# Patient Record
Sex: Male | Born: 1975
Health system: Southern US, Community
[De-identification: ages and names within clinical notes are randomized; demographics above are authoritative.]

## PROBLEM LIST (undated history)

## (undated) DIAGNOSIS — R5382 Chronic fatigue, unspecified: Secondary | ICD-10-CM

## (undated) DIAGNOSIS — E039 Hypothyroidism, unspecified: Secondary | ICD-10-CM

## (undated) DIAGNOSIS — J449 Chronic obstructive pulmonary disease, unspecified: Secondary | ICD-10-CM

## (undated) DIAGNOSIS — W3400XA Accidental discharge from unspecified firearms or gun, initial encounter: Secondary | ICD-10-CM

## (undated) DIAGNOSIS — G4700 Insomnia, unspecified: Secondary | ICD-10-CM

## (undated) DIAGNOSIS — I471 Supraventricular tachycardia, unspecified: Secondary | ICD-10-CM

## (undated) DIAGNOSIS — F419 Anxiety disorder, unspecified: Secondary | ICD-10-CM

## (undated) DIAGNOSIS — R413 Other amnesia: Secondary | ICD-10-CM

## (undated) DIAGNOSIS — M1990 Unspecified osteoarthritis, unspecified site: Secondary | ICD-10-CM

## (undated) DIAGNOSIS — J42 Unspecified chronic bronchitis: Secondary | ICD-10-CM

## (undated) DIAGNOSIS — J45909 Unspecified asthma, uncomplicated: Secondary | ICD-10-CM

## (undated) DIAGNOSIS — G43909 Migraine, unspecified, not intractable, without status migrainosus: Secondary | ICD-10-CM

## (undated) DIAGNOSIS — F909 Attention-deficit hyperactivity disorder, unspecified type: Secondary | ICD-10-CM

## (undated) DIAGNOSIS — R7303 Prediabetes: Secondary | ICD-10-CM

## (undated) DIAGNOSIS — G9050 Complex regional pain syndrome I, unspecified: Secondary | ICD-10-CM

## (undated) DIAGNOSIS — F32A Depression, unspecified: Secondary | ICD-10-CM

## (undated) DIAGNOSIS — D72829 Elevated white blood cell count, unspecified: Secondary | ICD-10-CM

## (undated) DIAGNOSIS — I10 Essential (primary) hypertension: Secondary | ICD-10-CM

## (undated) DIAGNOSIS — E2740 Unspecified adrenocortical insufficiency: Secondary | ICD-10-CM

## (undated) DIAGNOSIS — K219 Gastro-esophageal reflux disease without esophagitis: Secondary | ICD-10-CM

## (undated) HISTORY — DX: Insomnia, unspecified: G47.00

## (undated) HISTORY — DX: Other amnesia: R41.3

## (undated) HISTORY — DX: Anxiety disorder, unspecified: F41.9

## (undated) HISTORY — DX: Supraventricular tachycardia, unspecified: I47.10

## (undated) HISTORY — DX: Unspecified adrenocortical insufficiency: E27.40

## (undated) HISTORY — DX: Unspecified chronic bronchitis: J42

## (undated) HISTORY — DX: Complex regional pain syndrome I, unspecified: G90.50

## (undated) HISTORY — DX: Attention-deficit hyperactivity disorder, unspecified type: F90.9

## (undated) HISTORY — DX: Prediabetes: R73.03

## (undated) HISTORY — DX: Hypothyroidism, unspecified: E03.9

## (undated) HISTORY — DX: Unspecified osteoarthritis, unspecified site: M19.90

## (undated) HISTORY — DX: Chronic obstructive pulmonary disease, unspecified: J44.9

## (undated) HISTORY — DX: Unspecified asthma, uncomplicated: J45.909

## (undated) HISTORY — DX: Chronic fatigue, unspecified: R53.82

## (undated) HISTORY — DX: Accidental discharge from unspecified firearms or gun, initial encounter: W34.00XA

## (undated) HISTORY — DX: Essential (primary) hypertension: I10

## (undated) HISTORY — DX: Gastro-esophageal reflux disease without esophagitis: K21.9

## (undated) HISTORY — DX: Elevated white blood cell count, unspecified: D72.829

## (undated) HISTORY — DX: Depression, unspecified: F32.A

## (undated) HISTORY — DX: Migraine, unspecified, not intractable, without status migrainosus: G43.909

---
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
## 2012-07-31 ENCOUNTER — Other Ambulatory Visit: Payer: Self-pay | Admitting: Occupational Medicine

## 2012-07-31 ENCOUNTER — Ambulatory Visit
Admission: RE | Admit: 2012-07-31 | Discharge: 2012-07-31 | Disposition: A | Discharge status: Home / Self Care | Payer: Worker's Compensation | Source: Ambulatory Visit | Attending: Occupational Medicine | Admitting: Occupational Medicine

## 2012-07-31 DIAGNOSIS — R52 Pain, unspecified: Secondary | ICD-10-CM

## 2015-06-22 ENCOUNTER — Other Ambulatory Visit: Payer: Self-pay | Admitting: Nurse Practitioner

## 2015-06-22 ENCOUNTER — Ambulatory Visit
Admission: RE | Admit: 2015-06-22 | Discharge: 2015-06-22 | Disposition: A | Discharge status: Home / Self Care | Payer: Worker's Compensation | Source: Ambulatory Visit | Attending: Nurse Practitioner | Admitting: Nurse Practitioner

## 2015-06-22 DIAGNOSIS — T1490XA Injury, unspecified, initial encounter: Secondary | ICD-10-CM

## 2015-06-22 DIAGNOSIS — R52 Pain, unspecified: Secondary | ICD-10-CM

## 2016-07-15 DIAGNOSIS — I10 Essential (primary) hypertension: Secondary | ICD-10-CM | POA: Diagnosis not present

## 2016-07-15 DIAGNOSIS — K219 Gastro-esophageal reflux disease without esophagitis: Secondary | ICD-10-CM | POA: Diagnosis not present

## 2016-07-15 DIAGNOSIS — E034 Atrophy of thyroid (acquired): Secondary | ICD-10-CM | POA: Diagnosis not present

## 2016-12-01 DIAGNOSIS — G43009 Migraine without aura, not intractable, without status migrainosus: Secondary | ICD-10-CM | POA: Diagnosis not present

## 2016-12-23 DIAGNOSIS — E034 Atrophy of thyroid (acquired): Secondary | ICD-10-CM | POA: Diagnosis not present

## 2016-12-23 DIAGNOSIS — I10 Essential (primary) hypertension: Secondary | ICD-10-CM | POA: Diagnosis not present

## 2016-12-30 DIAGNOSIS — L821 Other seborrheic keratosis: Secondary | ICD-10-CM | POA: Diagnosis not present

## 2016-12-30 DIAGNOSIS — D1801 Hemangioma of skin and subcutaneous tissue: Secondary | ICD-10-CM | POA: Diagnosis not present

## 2017-04-27 DIAGNOSIS — I10 Essential (primary) hypertension: Secondary | ICD-10-CM | POA: Diagnosis not present

## 2017-04-27 DIAGNOSIS — R5383 Other fatigue: Secondary | ICD-10-CM | POA: Diagnosis not present

## 2017-04-27 DIAGNOSIS — K219 Gastro-esophageal reflux disease without esophagitis: Secondary | ICD-10-CM | POA: Diagnosis not present

## 2017-04-27 DIAGNOSIS — E038 Other specified hypothyroidism: Secondary | ICD-10-CM | POA: Diagnosis not present

## 2017-04-27 DIAGNOSIS — Z23 Encounter for immunization: Secondary | ICD-10-CM | POA: Diagnosis not present

## 2017-08-17 DIAGNOSIS — L03211 Cellulitis of face: Secondary | ICD-10-CM | POA: Diagnosis not present

## 2017-08-21 DIAGNOSIS — J060 Acute laryngopharyngitis: Secondary | ICD-10-CM | POA: Diagnosis not present

## 2017-08-26 DIAGNOSIS — L309 Dermatitis, unspecified: Secondary | ICD-10-CM | POA: Diagnosis not present

## 2017-08-27 DIAGNOSIS — B379 Candidiasis, unspecified: Secondary | ICD-10-CM | POA: Diagnosis not present

## 2017-10-24 DIAGNOSIS — J9801 Acute bronchospasm: Secondary | ICD-10-CM | POA: Diagnosis not present

## 2017-10-24 DIAGNOSIS — R079 Chest pain, unspecified: Secondary | ICD-10-CM | POA: Diagnosis not present

## 2017-10-24 DIAGNOSIS — R918 Other nonspecific abnormal finding of lung field: Secondary | ICD-10-CM | POA: Diagnosis not present

## 2017-10-24 DIAGNOSIS — R05 Cough: Secondary | ICD-10-CM | POA: Diagnosis not present

## 2017-10-24 DIAGNOSIS — R062 Wheezing: Secondary | ICD-10-CM | POA: Diagnosis not present

## 2017-10-24 DIAGNOSIS — J208 Acute bronchitis due to other specified organisms: Secondary | ICD-10-CM | POA: Diagnosis not present

## 2017-11-16 DIAGNOSIS — Z719 Counseling, unspecified: Secondary | ICD-10-CM | POA: Diagnosis not present

## 2017-11-23 DIAGNOSIS — K219 Gastro-esophageal reflux disease without esophagitis: Secondary | ICD-10-CM | POA: Diagnosis not present

## 2017-11-23 DIAGNOSIS — R5383 Other fatigue: Secondary | ICD-10-CM | POA: Diagnosis not present

## 2017-11-23 DIAGNOSIS — I10 Essential (primary) hypertension: Secondary | ICD-10-CM | POA: Diagnosis not present

## 2017-11-23 DIAGNOSIS — E038 Other specified hypothyroidism: Secondary | ICD-10-CM | POA: Diagnosis not present

## 2018-01-17 DIAGNOSIS — R799 Abnormal finding of blood chemistry, unspecified: Secondary | ICD-10-CM | POA: Diagnosis not present

## 2018-01-17 DIAGNOSIS — E034 Atrophy of thyroid (acquired): Secondary | ICD-10-CM | POA: Diagnosis not present

## 2018-02-27 DIAGNOSIS — I10 Essential (primary) hypertension: Secondary | ICD-10-CM | POA: Diagnosis not present

## 2018-02-27 DIAGNOSIS — K219 Gastro-esophageal reflux disease without esophagitis: Secondary | ICD-10-CM | POA: Diagnosis not present

## 2018-05-07 DIAGNOSIS — J060 Acute laryngopharyngitis: Secondary | ICD-10-CM | POA: Diagnosis not present

## 2018-06-14 DIAGNOSIS — Z6835 Body mass index (BMI) 35.0-35.9, adult: Secondary | ICD-10-CM | POA: Diagnosis not present

## 2018-06-14 DIAGNOSIS — Z0000 Encounter for general adult medical examination without abnormal findings: Secondary | ICD-10-CM | POA: Diagnosis not present

## 2018-09-20 DIAGNOSIS — K219 Gastro-esophageal reflux disease without esophagitis: Secondary | ICD-10-CM | POA: Diagnosis not present

## 2018-09-20 DIAGNOSIS — E782 Mixed hyperlipidemia: Secondary | ICD-10-CM | POA: Diagnosis not present

## 2018-09-20 DIAGNOSIS — E038 Other specified hypothyroidism: Secondary | ICD-10-CM | POA: Diagnosis not present

## 2018-09-20 DIAGNOSIS — I10 Essential (primary) hypertension: Secondary | ICD-10-CM | POA: Diagnosis not present

## 2018-10-19 DIAGNOSIS — I10 Essential (primary) hypertension: Secondary | ICD-10-CM | POA: Diagnosis not present

## 2018-10-19 DIAGNOSIS — E039 Hypothyroidism, unspecified: Secondary | ICD-10-CM | POA: Diagnosis not present

## 2018-10-19 DIAGNOSIS — K219 Gastro-esophageal reflux disease without esophagitis: Secondary | ICD-10-CM | POA: Diagnosis not present

## 2018-10-19 DIAGNOSIS — N529 Male erectile dysfunction, unspecified: Secondary | ICD-10-CM | POA: Diagnosis not present

## 2018-10-19 DIAGNOSIS — R5382 Chronic fatigue, unspecified: Secondary | ICD-10-CM | POA: Diagnosis not present

## 2018-11-02 DIAGNOSIS — K219 Gastro-esophageal reflux disease without esophagitis: Secondary | ICD-10-CM | POA: Diagnosis not present

## 2018-11-02 DIAGNOSIS — I10 Essential (primary) hypertension: Secondary | ICD-10-CM | POA: Diagnosis not present

## 2018-11-29 DIAGNOSIS — J309 Allergic rhinitis, unspecified: Secondary | ICD-10-CM | POA: Diagnosis not present

## 2018-11-29 DIAGNOSIS — K219 Gastro-esophageal reflux disease without esophagitis: Secondary | ICD-10-CM | POA: Diagnosis not present

## 2020-02-28 ENCOUNTER — Encounter: Payer: Self-pay | Admitting: Emergency Medicine

## 2020-02-28 ENCOUNTER — Ambulatory Visit: Payer: 59 | Admitting: Emergency Medicine

## 2020-02-28 ENCOUNTER — Ambulatory Visit (INDEPENDENT_AMBULATORY_CARE_PROVIDER_SITE_OTHER): Payer: 59

## 2020-02-28 ENCOUNTER — Other Ambulatory Visit: Payer: Self-pay

## 2020-02-28 DIAGNOSIS — R0600 Dyspnea, unspecified: Secondary | ICD-10-CM | POA: Insufficient documentation

## 2020-02-28 DIAGNOSIS — R0602 Shortness of breath: Secondary | ICD-10-CM | POA: Diagnosis not present

## 2020-02-28 NOTE — Assessment & Plan Note (Signed)
Persistent symptoms, frequent prednisone/steroid use. Question multifactorial.  Some of his symptoms sound consistent with obstructive lung disease, some upper airway lability.  Consider also impact of GERD, rhinitis, even anxiety or panic.  I think he needs pulmonary function testing to assess his degree of obstruction.  Given his age we will check an alpha-1 antitrypsin genotype and phenotype. CXR today.  Continue Symbicort 2 puffs twice a day for now.  We may decide to change this going forward Keep albuterol available use 2 puffs when you need if shortness of breath, chest tightness, wheezing. We will perform full pulmonary function testing in next office visit Blood work today (alpha-1 antitrypsin testing) Follow with Dr. Lamonte Sakai next available with full pulmonary function testing on the same day.

## 2020-02-28 NOTE — Progress Notes (Signed)
Subjective:    Patient ID: Gary Holland, male    DOB: [birth year], 44 y.o.   MRN: [id number]   HPI 44 year old man with a history of heavy tobacco use (150 pack years, quit 3 yrs ago), history of ADD, GERD, hypertension, COPD/asthma that was diagnosed in his 72's. Has been on Symbicort - wasn't sure it did very much.  He and his wife both had Covid in fall 2020.  He improved, only residual symptoms were loss of taste and smell.  Has been experiencing dyspnea since about 2 months ago.  Present at rest or w exertion, often associated with episodes of tachycardia. No documented A fib. Has chest pressure and heaviness. He hears some wheeze at night, when he is supine. He has cough, often induced by heat, cold, exercise. Usually non-productive. He has been on prednisone 5 times this year. Last kenalog was last week.   He was evaluated by cardiology 01/23/2020.  Stress echocardiogram done showed no ECG changes or echocardiographic changes consistent with ischemia. His Adderall was held while the cards eval is underway.    Review of Systems As per HPI  Past Medical History:  Diagnosis Date  . Asthma   . Bronchitis, chronic ([place])   . COPD (chronic obstructive pulmonary disease) ([facility])   ADD, GERD, hypertension, anxiety  Family history: Hypertension, thyroid cancer, diabetic kidney disease, CAD  Social History   Socioeconomic History  . Marital status: Single    Spouse name: Not on file  . Number of children: Not on file  . Years of education: Not on file  . Highest education level: Not on file  Occupational History  . Not on file  Tobacco Use  . Smoking status: Former Smoker    Packs/day: 5.00    Years: 30.00    Pack years: 150.00    Types: Cigarettes    Quit date: 2018    Years since quitting: 3.6  . Smokeless tobacco: Former Network engineer and Sexual Activity  . Alcohol use: Not on file  . Drug use: Not on file  . Sexual activity: Not on file  Other Topics Concern  . Not  on file  Social History Narrative  . Not on file   Social Determinants of Health   Financial Resource Strain:   . Difficulty of Paying Living Expenses: Not on file  Food Insecurity:   . Worried About Charity fundraiser in the Last Year: Not on file  . Ran Out of Food in the Last Year: Not on file  Transportation Needs:   . Lack of Transportation (Medical): Not on file  . Lack of Transportation (Non-Medical): Not on file  Physical Activity:   . Days of Exercise per Week: Not on file  . Minutes of Exercise per Session: Not on file  Stress:   . Feeling of Stress : Not on file  Social Connections:   . Frequency of Communication with Friends and Family: Not on file  . Frequency of Social Gatherings with Friends and Family: Not on file  . Attends Religious Services: Not on file  . Active Member of Clubs or Organizations: Not on file  . Attends Archivist Meetings: Not on file  . Marital Status: Not on file  Intimate Partner Violence:   . Fear of Current or Ex-Partner: Not on file  . Emotionally Abused: Not on file  . Physically Abused: Not on file  . Sexually Abused: Not on file   He has worked  as a heavy Company secretary doing plating for [employer]  Not on File   Outpatient Medications Prior to Visit  Medication Sig Dispense Refill  . albuterol (VENTOLIN HFA) 108 (90 Base) MCG/ACT inhaler SMARTSIG:2 Puff(s) By Mouth Every 4 Hours PRN    . ALPRAZolam (XANAX) 0.25 MG tablet Take 0.25 mg by mouth every 6 (six) hours as needed.    Marland Kitchen amphetamine-dextroamphetamine (ADDERALL XR) 25 MG 24 hr capsule Take 1 capsule by mouth 2 (two) times daily.    . cetirizine (ZYRTEC) 10 MG tablet cetirizine 10 mg tablet  TK 1 T PO QD    . escitalopram (LEXAPRO) 10 MG tablet Take 10 mg by mouth daily.    . hydrochlorothiazide (HYDRODIURIL) 25 MG tablet Take 25 mg by mouth daily.    Marland Kitchen levothyroxine (SYNTHROID) 200 MCG tablet Take 200 mcg by mouth daily.    . meloxicam (MOBIC) 15 MG  tablet Take 15 mg by mouth 3 (three) times daily as needed.    . metoprolol succinate (TOPROL-XL) 100 MG 24 hr tablet Take 100 mg by mouth daily.    . nitroGLYCERIN (NITROSTAT) 0.4 MG SL tablet Place under the tongue.    . sildenafil (VIAGRA) 100 MG tablet Take 1 tablet by mouth as needed.    . SYMBICORT 160-4.5 MCG/ACT inhaler SMARTSIG:2 Puff(s) By Mouth Twice Daily    . traMADol (ULTRAM) 50 MG tablet Take 50 mg by mouth 3 (three) times daily as needed.    . zolpidem (AMBIEN) 10 MG tablet Take 10 mg by mouth at bedtime as needed.     No facility-administered medications prior to visit.         Objective:   Physical Exam Vitals:   02/28/20 1445  BP: 124/72  Pulse: (!) 104  Temp: 97.8 F (36.6 C)  TempSrc: Temporal  SpO2: 98%  Weight: 240 lb 12.8 oz (109.2 kg)  Height: 5\' 8"  (1.727 m)   Gen: Pleasant, overwt man, in no distress,  normal affect  ENT: No lesions,  mouth clear,  oropharynx clear, no postnasal drip  Neck: No JVD, stridor present on a forced exp associated w cough  Lungs: No use of accessory muscles, distant, no crackles or wheezing on normal respiration, no wheeze on forced expiration  Cardiovascular: RRR, heart sounds normal, no murmur or gallops, no peripheral edema  Musculoskeletal: No deformities, no cyanosis or clubbing  Neuro: alert, awake, non focal  Skin: Warm, no lesions or rash      Assessment & Plan:  Dyspnea Persistent symptoms, frequent prednisone/steroid use. Question multifactorial.  Some of his symptoms sound consistent with obstructive lung disease, some upper airway lability.  Consider also impact of GERD, rhinitis, even anxiety or panic.  I think he needs pulmonary function testing to assess his degree of obstruction.  Given his age we will check an alpha-1 antitrypsin genotype and phenotype. CXR today.  Continue Symbicort 2 puffs twice a day for now.  We may decide to change this going forward Keep albuterol available use 2 puffs when  you need if shortness of breath, chest tightness, wheezing. We will perform full pulmonary function testing in next office visit Blood work today (alpha-1 antitrypsin testing) Follow with Dr. Lamonte Sakai next available with full pulmonary function testing on the same day.   Baltazar Apo, MD, PhD 02/28/2020, 3:19 PM [facility] [phone] or if no answer [phone]

## 2020-02-28 NOTE — Patient Instructions (Signed)
Continue Symbicort 2 puffs twice a day for now.  We may decide to change this going forward Keep albuterol available use 2 puffs when you need if shortness of breath, chest tightness, wheezing. We will perform full pulmonary function testing in next office visit Blood work today (alpha-1 antitrypsin testing) Follow with Dr. Lamonte Sakai next available with full pulmonary function testing on the same day.

## 2020-03-11 LAB — ALPHA-1 ANTITRYPSIN PHENOTYPE: A-1 Antitrypsin, Ser: 154 mg/dL (ref 83–199)

## 2020-04-13 ENCOUNTER — Encounter: Payer: Self-pay | Admitting: Emergency Medicine

## 2020-04-13 ENCOUNTER — Other Ambulatory Visit: Payer: Self-pay

## 2020-04-13 ENCOUNTER — Ambulatory Visit (INDEPENDENT_AMBULATORY_CARE_PROVIDER_SITE_OTHER): Payer: 59 | Admitting: Emergency Medicine

## 2020-04-13 ENCOUNTER — Ambulatory Visit: Payer: 59 | Admitting: Emergency Medicine

## 2020-04-13 DIAGNOSIS — R0602 Shortness of breath: Secondary | ICD-10-CM

## 2020-04-13 LAB — PULMONARY FUNCTION TEST
DL/VA % pred: 82 %
DL/VA: 3.77 ml/min/mmHg/L
DLCO cor % pred: 81 %
DLCO cor: 23.03 ml/min/mmHg
DLCO unc % pred: 81 %
DLCO unc: 23.03 ml/min/mmHg
FEF 25-75 Post: 1.84 L/sec
FEF 25-75 Pre: 3.19 L/sec
FEF2575-%Change-Post: -42 %
FEF2575-%Pred-Post: 51 %
FEF2575-%Pred-Pre: 90 %
FEV1-%Change-Post: -9 %
FEV1-%Pred-Post: 82 %
FEV1-%Pred-Pre: 91 %
FEV1-Post: 3.17 L
FEV1-Pre: 3.51 L
FEV1FVC-%Change-Post: 9 %
FEV1FVC-%Pred-Pre: 100 %
FEV6-%Change-Post: -17 %
FEV6-%Pred-Post: 77 %
FEV6-%Pred-Pre: 94 %
FEV6-Post: 3.64 L
FEV6-Pre: 4.43 L
FEV6FVC-%Pred-Post: 103 %
FEV6FVC-%Pred-Pre: 103 %
FVC-%Change-Post: -17 %
FVC-%Pred-Post: 75 %
FVC-%Pred-Pre: 91 %
FVC-Post: 3.64 L
FVC-Pre: 4.43 L
Post FEV1/FVC ratio: 87 %
Post FEV6/FVC ratio: 100 %
Pre FEV1/FVC ratio: 79 %
Pre FEV6/FVC Ratio: 100 %
RV % pred: 77 %
RV: 1.41 L
TLC % pred: 91 %
TLC: 6.01 L

## 2020-04-13 MED ORDER — DEXILANT 60 MG PO CPDR: DELAYED_RELEASE_CAPSULE | ORAL | 0 refills | Status: AC

## 2020-04-13 NOTE — Patient Instructions (Signed)
Congratulations on decreasing your e-cigarettes.  Keep up the good work, try to decrease further if you can.  Our ultimate goal will be to stop completely. Stop Symbicort. Keep your albuterol available use 2 puffs if needed for shortness of breath, chest tightness, wheezing. Temporarily increase your Dexilant to twice a day for the next 3 to 4 weeks.  Then go back to once a day Agree with raising the head of your bed Do your best to adhere to a GERD diet.  Do not eat after 8:00 PM. Continue Zyrtec once daily.  You might want to consider rotating this every few months with Claritin, Allegra Start taking your fluticasone (Flonase) nasal spray 2 sprays each nostril once daily every day Do you very best to avoid throat clearing.  You might get some benefit from using sugar-free candy or not mentholated cough drop.  When you have the desire to clear your throat, just swallow Please follow-up with Dr. Truman Hayward regarding your blood pressure medication and the side effects that you are experiencing. Follow Dr. Lamonte Sakai in 3 months.  Call sooner if you are having any problems.

## 2020-04-13 NOTE — Addendum Note (Signed)
Addended by: Gavin Potters R on: 04/13/2020 12:17 PM   Modules accepted: Orders

## 2020-04-13 NOTE — Progress Notes (Signed)
Subjective:    Patient ID: Gary Holland, male    DOB: 16-Apr-1976, 44 y.o.   MRN: [id number]   HPI 44 year old man with a history of heavy tobacco use (150 pack years, quit 3 yrs ago), history of ADD, GERD, hypertension, COPD/asthma that was diagnosed in his 34's. Has been on Symbicort - wasn't sure it did very much.  He and his wife both had Covid in fall 2020.  He improved, only residual symptoms were loss of taste and smell.  Has been experiencing dyspnea since about 2 months ago.  Present at rest or w exertion, often associated with episodes of tachycardia. No documented A fib. Has chest pressure and heaviness. He hears some wheeze at night, when he is supine. He has cough, often induced by heat, cold, exercise. Usually non-productive. He has been on prednisone 5 times this year. Last kenalog was last week.   He was evaluated by cardiology 01/23/2020.  Stress echocardiogram done showed no ECG changes or echocardiographic changes consistent with ischemia. His Adderall was held while the cards eval is underway.   ROV 04/13/20 --follow-up visit for 44 year old former heavy smoker with ADD, suspected asthma diagnosed in his 24s.  He had COVID-19 in fall 2020.  Initially improved but he has been having exertional shortness of breath for about 3-4 months.  Frequent episodic symptoms of SOB and cough that have responded to prednisone with multiple tapers in the last year.  Cardiac evaluation reassuring.   He is still having a lot of cough, when hot and at night when he lays down.  On zyrtec, dexilant. Remains on symbicort, unsure that it helps  He underwent pulmonary function testing today which I have reviewed, shows overall normal airflows with a decreased FEV1 after bronchodilator (question irritation, question fatigue).  His lung volumes suggest possible restriction based on a low RV.  His diffusion capacity was normal.  The flow volume loop showed significant inspiratory loop variability  suggestive of possible upper airway disease.  CXR 02/28/2020 reviewed by me, normal.  A-1 AT genotype MM (02/28/20)   Review of Systems As per HPI      Objective:   Physical Exam Vitals:   04/13/20 1122  BP: 120/68  Pulse: 89  Temp: (!) 97.2 F (36.2 C)  TempSrc: Temporal  SpO2: 98%  Weight: 234 lb (106.1 kg)  Height: 5\' 8"  (1.727 m)   Gen: Pleasant, overwt man, in no distress,  normal affect  ENT: No lesions,  mouth clear,  oropharynx clear, no postnasal drip  Neck: No JVD, stridor present on a forced exp associated w cough  Lungs: No use of accessory muscles, distant, no crackles or wheezing on normal respiration, no wheeze on forced expiration  Cardiovascular: RRR, heart sounds normal, no murmur or gallops, no peripheral edema  Musculoskeletal: No deformities, no cyanosis or clubbing  Neuro: alert, awake, non focal  Skin: Warm, no lesions or rash      Assessment & Plan:  Dyspnea Pulmonary function testing with very little evidence for overt obstructive disease but he does have a sawtooth pattern on his flow volume loop that suggest upper airway irritation.  His symptoms are reminiscent of this as well.  We will try to avoid upper airway irritants which should help with his dyspnea and his cough.  He can keep albuterol available to use if needed.  Stop the Symbicort.  Congratulations on decreasing your e-cigarettes.  Keep up the good work, try to decrease further if you can.  Our ultimate goal will be to stop completely. Stop Symbicort. Keep your albuterol available use 2 puffs if needed for shortness of breath, chest tightness, wheezing. Temporarily increase your Dexilant to twice a day for the next 3 to 4 weeks.  Then go back to once a day Agree with raising the head of your bed Do your best to adhere to a GERD diet.  Do not eat after 8:00 PM. Continue Zyrtec once daily.  You might want to consider rotating this every few months with Claritin, Allegra Start  taking your fluticasone (Flonase) nasal spray 2 sprays each nostril once daily every day Do you very best to avoid throat clearing.  You might get some benefit from using sugar-free candy or not mentholated cough drop.  When you have the desire to clear your throat, just swallow Please follow-up with Dr. Truman Hayward regarding your blood pressure medication and the side effects that you are experiencing. Follow Dr. Lamonte Sakai in 3 months.  Call sooner if you are having any problems.  Baltazar Apo, MD, PhD 04/13/2020, 11:55 AM  [facility] [phone] or if no answer [phone]

## 2020-04-13 NOTE — Progress Notes (Signed)
PFT done today. 

## 2020-04-13 NOTE — Assessment & Plan Note (Signed)
Pulmonary function testing with very little evidence for overt obstructive disease but he does have a sawtooth pattern on his flow volume loop that suggest upper airway irritation.  His symptoms are reminiscent of this as well.  We will try to avoid upper airway irritants which should help with his dyspnea and his cough.  He can keep albuterol available to use if needed.  Stop the Symbicort.  Congratulations on decreasing your e-cigarettes.  Keep up the good work, try to decrease further if you can.  Our ultimate goal will be to stop completely. Stop Symbicort. Keep your albuterol available use 2 puffs if needed for shortness of breath, chest tightness, wheezing. Temporarily increase your Dexilant to twice a day for the next 3 to 4 weeks.  Then go back to once a day Agree with raising the head of your bed Do your best to adhere to a GERD diet.  Do not eat after 8:00 PM. Continue Zyrtec once daily.  You might want to consider rotating this every few months with Claritin, Allegra Start taking your fluticasone (Flonase) nasal spray 2 sprays each nostril once daily every day Do you very best to avoid throat clearing.  You might get some benefit from using sugar-free candy or not mentholated cough drop.  When you have the desire to clear your throat, just swallow Please follow-up with Dr. Truman Hayward regarding your blood pressure medication and the side effects that you are experiencing. Follow Dr. Lamonte Sakai in 3 months.  Call sooner if you are having any problems.

## 2020-04-22 ENCOUNTER — Telehealth: Payer: Self-pay | Admitting: Emergency Medicine

## 2020-04-22 NOTE — Telephone Encounter (Signed)
PA received for pt's Dexilant 60mg .  Covered formulary alternatives may include: -pantoprazole (no PA required) -omeprazole (no PA required) -famotidine (no PA required)  Attempted to call pt to see if he has tried/failed any of the above meds and if he has, we should be able to get the PA initiated. Unable to reach pt but left him a message for him to return call.  Will await a return call from pt with the above info prior to [place] initiating the PA.

## 2020-04-24 ENCOUNTER — Telehealth: Payer: Self-pay | Admitting: Emergency Medicine

## 2020-04-24 NOTE — Telephone Encounter (Signed)
Messages from encounter 10/22:   Collene Gobble, MD  Physician  Pulmonology  Telephone Encounter  Signed  Creation Time:  04/24/2020 11:12 AM          Signed        I would substitute Pantoprazole                 Carloyn Manner, [place]  Certified Medical Assistant    Telephone Encounter  Signed  Creation Time:  04/24/2020 10:34 AM          Signed        Pt insurance will not cover dexilant 60 mg price is 292.48 but insurance is willing to cover medications alternatives such as pantoprazole sodium $47.49, omeprazole $27.99, famotidine $30.99 will you be willing to try pt on one of the alternatives suggested by insurance               Attempted to call pt but unable to reach. Left message for him to return call.

## 2020-04-24 NOTE — Telephone Encounter (Addendum)
Please see encounter from 10/20 as this is now a duplicate encounter to that message.

## 2020-04-24 NOTE — Telephone Encounter (Signed)
I would substitute Pantoprazole

## 2020-04-24 NOTE — Telephone Encounter (Signed)
Pt insurance will not cover dexilant 60 mg price is 292.48 but insurance is willing to cover medications alternatives such as pantoprazole sodium $47.49, omeprazole $27.99, famotidine $30.99 will you be willing to try pt on one of the alternatives suggested by insurance

## 2020-05-07 ENCOUNTER — Encounter: Payer: Self-pay | Admitting: *Deleted

## 2020-05-07 ENCOUNTER — Telehealth: Payer: Self-pay | Admitting: Neurology

## 2020-05-07 ENCOUNTER — Encounter: Payer: Self-pay | Admitting: Psychology

## 2020-05-07 ENCOUNTER — Encounter: Payer: Self-pay | Admitting: Neurology

## 2020-05-07 ENCOUNTER — Ambulatory Visit: Payer: 59 | Admitting: Neurology

## 2020-05-07 VITALS — BP 127/80 | HR 79 | Ht 68.0 in | Wt 240.5 lb

## 2020-05-07 DIAGNOSIS — E669 Obesity, unspecified: Secondary | ICD-10-CM | POA: Diagnosis not present

## 2020-05-07 DIAGNOSIS — R419 Unspecified symptoms and signs involving cognitive functions and awareness: Secondary | ICD-10-CM

## 2020-05-07 DIAGNOSIS — R413 Other amnesia: Secondary | ICD-10-CM | POA: Diagnosis not present

## 2020-05-07 DIAGNOSIS — Z8669 Personal history of other diseases of the nervous system and sense organs: Secondary | ICD-10-CM | POA: Diagnosis not present

## 2020-05-07 NOTE — Telephone Encounter (Signed)
[employer] auth: [employer] via [employer] website order sent to [facility]. No auth they will reach out to the patient to schedule.  
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
## 2020-05-07 NOTE — Patient Instructions (Addendum)
You have complaints of memory loss: memory loss or changes in cognitive function can have many reasons and does not always mean you have dementia. Conditions that can contribute to subjective or objective memory loss include: depression, stress, poor sleep from insomnia or sleep apnea, dehydration, fluctuation in blood sugar values, thyroid or electrolyte dysfunction and certain vitamin deficiencies. Dementia can be caused by stroke, brain atherosclerosis or brain vascular disease due to vascular risk factors (smoking, high blood pressure, high cholesterol, obesity and uncontrolled diabetes), certain degenerative brain disorders (including Parkinson's disease and Multiple sclerosis) and by Alzheimer's disease or other, more rare and sometimes hereditary causes. We will do some additional testing: blood work (which has been done recently already) and we will do a brain CT, as you cannot have an MRI scan. We will not start medication as yet. We will also request a formal cognitive test called neuropsychological evaluation which is done by a licensed neuropsychologist. We will make a referral in that regard. We will call you with brain CT test results and monitor your symptoms. Your memory loss is rather mild at this point, which, of course is reassuring.  We will do a sleep study to help rule out obstructive sleep apnea. If you have sleep apnea, I may ask you to start using a CPAP or AutoPap machine.  We will request blood test results from your primary care physician's office.  Also, please talk to Dr. Truman Hayward as soon as possible about your hallucinations which started after you start ropinirole.  You may have to come off this medication.

## 2020-05-18 ENCOUNTER — Ambulatory Visit
Admission: RE | Admit: 2020-05-18 | Discharge: 2020-05-18 | Disposition: A | Discharge status: Home / Self Care | Payer: 59 | Source: Ambulatory Visit | Attending: Neurology | Admitting: Neurology

## 2020-05-18 ENCOUNTER — Other Ambulatory Visit: Payer: Self-pay

## 2020-05-18 DIAGNOSIS — R419 Unspecified symptoms and signs involving cognitive functions and awareness: Secondary | ICD-10-CM

## 2020-05-18 DIAGNOSIS — E669 Obesity, unspecified: Secondary | ICD-10-CM

## 2020-05-18 DIAGNOSIS — Z8669 Personal history of other diseases of the nervous system and sense organs: Secondary | ICD-10-CM

## 2020-05-18 DIAGNOSIS — R413 Other amnesia: Secondary | ICD-10-CM

## 2020-05-18 MED ORDER — IOPAMIDOL (ISOVUE-300) INJECTION 61%
75.0000 mL | Freq: Once | INTRAVENOUS | Status: AC | PRN
  Administered 2020-05-18: 75 mL via INTRAVENOUS

## 2020-05-20 ENCOUNTER — Telehealth: Payer: Self-pay

## 2020-05-20 NOTE — Progress Notes (Signed)
Please call patient and advise him that his head CT with and without contrast was reported as normal.  As discussed, we will proceed with sleep testing and cognitive evaluation with neuropsychology.

## 2020-05-20 NOTE — Telephone Encounter (Signed)
I called pt. No answer, left a message on cell phone number per DPR advising him of CT results and recommendations and asked him to call me back with questions or concerns.

## 2020-05-20 NOTE — Telephone Encounter (Signed)
-----   Message from Star Age, MD sent at 05/20/2020 12:37 PM EST ----- Please call patient and advise him that his head CT with and without contrast was reported as normal.  As discussed, we will proceed with sleep testing and cognitive evaluation with neuropsychology.

## 2020-08-04 ENCOUNTER — Encounter: Payer: 59 | Attending: Psychology | Admitting: Psychology

## 2020-08-04 ENCOUNTER — Encounter: Payer: Self-pay | Admitting: Psychology

## 2020-08-04 ENCOUNTER — Other Ambulatory Visit: Payer: Self-pay

## 2020-08-04 DIAGNOSIS — R413 Other amnesia: Secondary | ICD-10-CM | POA: Insufficient documentation
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
## 2020-09-18 ENCOUNTER — Encounter: Payer: 59 | Attending: Psychology | Admitting: Psychology

## 2020-09-18 ENCOUNTER — Other Ambulatory Visit: Payer: Self-pay

## 2020-09-18 ENCOUNTER — Encounter: Payer: Self-pay | Admitting: Psychology

## 2020-09-18 DIAGNOSIS — R413 Other amnesia: Secondary | ICD-10-CM

## 2020-09-18 DIAGNOSIS — G90512 Complex regional pain syndrome I of left upper limb: Secondary | ICD-10-CM | POA: Insufficient documentation

## 2020-09-18 DIAGNOSIS — G479 Sleep disorder, unspecified: Secondary | ICD-10-CM | POA: Insufficient documentation

## 2020-09-18 NOTE — Progress Notes (Addendum)
.  Neuropsychology Note  Gary Holland completed 240 minutes of neuropsychological testing with this provider Health and safety inspector). Ambulated with a cane for assistance. He appeared his stated age and overweight. He was casually dressed and well groomed. Vision corrected by glasses. Eye contact was good but prolonged (starting)  at times. Mildly anxious and full affect. Vision corrected by glasses. Many visible tattoos and piercing. Patient did not appear overtly distressed by the testing session, per behavioral observation or via self-report. Rest breaks were offered. Effort and motivation were good.  Psychomotor agitation and appeared fidgeting throughout the appointment.    Clinical Decision Making: In considering the patient's current level of functioning, level of presumed impairment, nature of symptoms, emotional and behavioral responses during the interview, level of literacy, and observed level of motivation/effort, a battery of tests was selected. Changes were made as deemed necessary based on patient performance on testing, behavioral observations and additional pertinent factors such as those listed above.  Tests Administered:  [place] Test (CVLT-3); Standard Version  [place] Test (A & B)  Wechsler Adult Intelligence Scale, 4th Edition (WAIS-IV)  Wechsler Memory Scale (WMS-IV): Adult Battery; Select Subtests       Results:  CPT Visual Monitoring: Accuracy Scores   Raw Counts 0-3  min   3-6  min 6-9 min 9-12 min 12-15 min 0-6 min 6-15  min Total  Hits 30 30 30 30 29  60 89 149  Misses 2 0 0 0 1 2 1 3   Omissions 0 0 0 0 1 0 1 1  Multiple Responses 0 0 0 0 0 0 0 0  Impulsive Responses 0 0 0 0 0 0 0 0    CPT Visual Monitoring: Response Times   Average (ms) 0-3  min 3-6  min 6-9  min 9-12  min 12-15  min 0-6  min 6-15  min Total  Hits 303 338 378 359 342 321 360 344  Misses 277 0 0 0 413 277 413 323       Raw  T %tile Description TMT   Part A:  16" 73 99 Exceptionally High  Part B:  69" 49 46 Average   Composite Score Summary  Scale Sum of Scaled Scores Composite Score Percentile Rank 95% Conf. Interval Qualitative Description  Verbal Comprehension 26 VCI 93 32 88-99 Average  Perceptual Reasoning 25 PRI 90 25 84-97 Average  Working Memory 17 WMI 92 30 86-99 Average  Processing Speed 26 PSI 117 87 107-124 High Average  Full Scale 94 FSIQ 96 39 92-100 Average  General Ability 51 GAI 91 27 86-96 Average    Verbal Comprehension Subtests Summary  Subtest Raw Score Scaled Score Percentile Rank Reference Group Scaled Score SEM  Similarities 23 9 37 9 1.04  Vocabulary 33 9 37 9 0.73  Information 10 8 25 8  0.73    Perceptual Reasoning Subtests Summary  Subtest Raw Score Scaled Score Percentile Rank Reference Group Scaled Score SEM  Block Design 37 9 37 8 0.95  Matrix Reasoning 12 7 16 6  0.95  Visual Puzzles 12 9 37 8 0.85    Working Doctor, general practice Raw Score Scaled Score Percentile Rank Reference Group Scaled Score SEM  Digit Span 28 10 50 10 0.73  Arithmetic 11 7 16 8  0.90    Working Memory Process Score Summary  Process Score Raw Score Scaled Score Percentile Rank Base Rate SEM  Digit Span Forward 9 9 37 -- 1.24  Digit  Span Backward 9 10 50 -- 1.12  Digit Span Sequencing 10 12 75 -- 1.27  Longest Digit Span Forward 6 -- -- 79.0 --  Longest Digit Span Backward 5 -- -- 55.5 --  Longest Digit Span Sequence 7 -- -- 23.0 --    Processing Speed Subtests Summary  Subtest Raw Score Scaled Score Percentile Rank Reference Group Scaled Score SEM  Symbol Search 40 14 91 12 1.56  Coding 78 12 75 11 1.20    WMS-IV: Adult Battery  Brief Cognitive Status Exam Classification  Age Years of Education Raw Score Classification Level Base Rate  44 years 9 months 13 51 Average 100.0    Primary Subtest Scaled Score Summary  Subtest Domain Raw Score Scaled Score Percentile Rank  Logical Memory I  AM 23 9 37  Logical Memory II AM 20 10 50  Visual Reproduction I VM 33 9 37  Visual Reproduction II VM 21 9 37  Symbol Span VWM 22 10 50    Auditory Memory Process Score Summary  Process Score Raw Score Scaled Score Percentile Rank Cumulative Percentage (Base Rate)  LM II Recognition 24 - - 26-50%    Visual Memory Process Score Summary  Process Score Raw Score Scaled Score Percentile Rank Cumulative Percentage (Base Rate)  VR II Recognition 5 - - 26-50%    CVLT-3 (Standard Form) Standard Score Summary Index Sum of scaled scores Index score Percentile rank    Trials 1-5 Correct 48 98 45    Delayed Recall Correct 40 100 50    Total Recall Correct 99 99 47     Immediate Recall Score Raw score Scaled score Percentile rank    Trial 1 Correct 4 7 16     Trial 2 Correct 9 10 50    Trial 3 Correct 9 8 25     Trial 4 Correct 14 13 84    Trial 5 Correct 12 10 50    List B Correct 6 11 63      Delayed Recall Score Raw score Scaled score Percentile rank    Short Delay Free Recall Correct 11 11 63    Short Delay Cued Recall Correct 12 10 50    Long Delay Free Recall Correct 11 10 50    Long Delay Cued Recall Correct 11 9 37     Yes/No Recognition Score Raw score Scaled score Percentile rank    Total Hits 15 10 50    Total False Positives 3 9 37    Recognition Discriminability (d') 2.9 9 37    Recognition Discriminability Nonparametric 92 10 50       Feedback to Patient: Gary Holland will return on 09/24/20 for an interactive feedback session with Dr. Sima Matas at which time his test performances, clinical impressions and treatment recommendations will be reviewed in detail. The patient understands he can contact our office should he require our assistance before this time.  Full report to follow.

## 2020-09-22 ENCOUNTER — Other Ambulatory Visit: Payer: Self-pay

## 2020-09-22 ENCOUNTER — Encounter (HOSPITAL_BASED_OUTPATIENT_CLINIC_OR_DEPARTMENT_OTHER): Payer: 59 | Admitting: Psychology

## 2020-09-22 DIAGNOSIS — Z789 Other specified health status: Secondary | ICD-10-CM | POA: Diagnosis not present

## 2020-09-22 DIAGNOSIS — G479 Sleep disorder, unspecified: Secondary | ICD-10-CM

## 2020-09-22 DIAGNOSIS — R413 Other amnesia: Secondary | ICD-10-CM

## 2020-09-22 DIAGNOSIS — G90512 Complex regional pain syndrome I of left upper limb: Secondary | ICD-10-CM | POA: Diagnosis not present

## 2020-09-25 ENCOUNTER — Encounter: Payer: Self-pay | Admitting: Psychology
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
## 2020-10-01 ENCOUNTER — Encounter (HOSPITAL_BASED_OUTPATIENT_CLINIC_OR_DEPARTMENT_OTHER): Payer: 59 | Admitting: Psychology

## 2020-10-01 ENCOUNTER — Encounter: Payer: 59 | Admitting: Psychology

## 2020-10-01 ENCOUNTER — Encounter: Payer: Self-pay | Admitting: Psychology

## 2020-10-01 ENCOUNTER — Other Ambulatory Visit: Payer: Self-pay

## 2020-10-01 DIAGNOSIS — R413 Other amnesia: Secondary | ICD-10-CM | POA: Diagnosis not present

## 2020-10-01 DIAGNOSIS — G479 Sleep disorder, unspecified: Secondary | ICD-10-CM

## 2020-10-01 DIAGNOSIS — G90512 Complex regional pain syndrome I of left upper limb: Secondary | ICD-10-CM | POA: Diagnosis not present
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
## 2020-10-06 ENCOUNTER — Telehealth: Payer: 59 | Admitting: Psychology

## 2021-02-03 DIAGNOSIS — Z0289 Encounter for other administrative examinations: Secondary | ICD-10-CM

## 2021-02-08 ENCOUNTER — Encounter: Payer: 59 | Attending: Psychology | Admitting: Psychology

## 2021-02-08 ENCOUNTER — Other Ambulatory Visit: Payer: Self-pay

## 2021-02-08 DIAGNOSIS — G479 Sleep disorder, unspecified: Secondary | ICD-10-CM | POA: Insufficient documentation

## 2021-02-08 DIAGNOSIS — R413 Other amnesia: Secondary | ICD-10-CM | POA: Diagnosis not present

## 2021-02-08 DIAGNOSIS — G90512 Complex regional pain syndrome I of left upper limb: Secondary | ICD-10-CM | POA: Insufficient documentation

## 2021-02-15 ENCOUNTER — Encounter: Payer: Self-pay | Admitting: Psychology
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
## 2021-05-03 ENCOUNTER — Encounter: Payer: 59 | Attending: Psychology | Admitting: Psychology

## 2021-05-03 ENCOUNTER — Other Ambulatory Visit: Payer: Self-pay

## 2021-05-03 DIAGNOSIS — G90512 Complex regional pain syndrome I of left upper limb: Secondary | ICD-10-CM | POA: Insufficient documentation

## 2021-05-03 DIAGNOSIS — R413 Other amnesia: Secondary | ICD-10-CM

## 2021-05-03 DIAGNOSIS — G479 Sleep disorder, unspecified: Secondary | ICD-10-CM | POA: Insufficient documentation

## 2021-05-19 ENCOUNTER — Encounter: Payer: Self-pay | Admitting: Psychology

## 2021-05-19 NOTE — Progress Notes (Signed)
05/03/2021 3 PM-4 PM:  Today's visit was an in person visit was conducted in my outpatient clinic office with the patient, myself and his wife present.   The patient is continue to work on issues related to posttraumatic pain and complex regional pain syndrome.  We have talked about various interventions and continue to work on therapeutic interventions around pain management, improving overall sleep patterns and his adjustment difficulties.  Therapeutic interventions include working on cognitive interpretations and coping skills as well as looking at interventions with ketamine infusion and/or ganglion block therapies

## 2021-06-07 ENCOUNTER — Encounter: Payer: 59 | Admitting: Psychology

## 2021-07-01 ENCOUNTER — Encounter: Payer: Self-pay | Admitting: Psychology

## 2021-08-10 ENCOUNTER — Encounter: Payer: BC Managed Care – PPO | Admitting: Psychology

## 2021-08-18 ENCOUNTER — Other Ambulatory Visit: Payer: Self-pay

## 2021-08-18 ENCOUNTER — Encounter: Payer: BC Managed Care – PPO | Attending: Psychology | Admitting: Psychology

## 2021-08-18 DIAGNOSIS — G90512 Complex regional pain syndrome I of left upper limb: Secondary | ICD-10-CM | POA: Insufficient documentation

## 2021-08-18 DIAGNOSIS — G479 Sleep disorder, unspecified: Secondary | ICD-10-CM | POA: Insufficient documentation

## 2021-08-19 ENCOUNTER — Encounter: Payer: Self-pay | Admitting: Psychology

## 2021-08-19 NOTE — Progress Notes (Signed)
08/18/2021 8 AM-9 AM: Today's visit was an in person visit that was conducted in my outpatient clinic office.  The patient and myself were present for this visit.  Today we continue to work on coping resources and strategies around dealing with his specific issues related to complex regional pain syndrome, sleeping difficulties and the impact this chronic pain condition and sleeping difficulties have on memory and cognitive functioning.  The patient continues to struggle with pain difficulties and has been working on strategies to try to better manage his pain.  At this point, the patient is not likely to be covered for ketamine infusion therapies but we have worked on better understanding strategies to address his chronic pain conditions and the impact it has on his capacity to work.  The patient is unable to maintain effective full-time gainful employment.  The patient is disabled at this time and the nature of his complex regional pain syndrome continues to produce significant deleterious impact on his ability to maintain any type of full-time gainful employment.  The patient is very frustrated by his inability to return to work and the limited ability to effectively manage his complex regional pain syndrome.  He continues to be motivated for improvement and we have worked on techniques to try to better manage his pain and sleep disturbance.

## 2021-11-18 ENCOUNTER — Ambulatory Visit: Payer: 59 | Admitting: Psychology

## 2021-12-22 ENCOUNTER — Encounter: Payer: BC Managed Care – PPO | Attending: Psychology | Admitting: Psychology

## 2021-12-22 ENCOUNTER — Encounter: Payer: Self-pay | Admitting: Psychology

## 2021-12-22 DIAGNOSIS — R413 Other amnesia: Secondary | ICD-10-CM | POA: Diagnosis present

## 2021-12-22 DIAGNOSIS — G90512 Complex regional pain syndrome I of left upper limb: Secondary | ICD-10-CM | POA: Diagnosis present

## 2021-12-22 DIAGNOSIS — G479 Sleep disorder, unspecified: Secondary | ICD-10-CM | POA: Diagnosis present

## 2021-12-22 NOTE — Progress Notes (Signed)
12/22/2021 9 AM-10 AM:  Today's visit was an in person visit as conducted in my outpatient clinic office.  The patient myself were present.  We continue to work on therapeutic interventions around coping strategies specifically related to his complex regional pain syndrome, sleep difficulties and impact his chronic pain condition has on memory and cognitive functioning.  The patient has continued to struggle with pain difficulties as well as symptoms related to potential long COVID types of symptoms.  The patient continues to have difficulties with fatigue etc. during extended periods of exertion.  Patient reports that he still has not recovered from lingering effects of COVID.  The patient reports that he is trying to complete his legal responsibilities for his probation and is progressing in that context.  However, given the nature of these legal aspects it is been challenging going through what is typically done and the differences between his own actions and what responsibilities he has has been challenging.  The patient has been frustrated by his inability to return to work and his difficulty managing his complex regional pain syndrome.  The patient continues to be very motivated and has been actively working in the therapeutic process.

## 2022-02-24 ENCOUNTER — Encounter: Payer: BC Managed Care – PPO | Admitting: Psychology

## 2022-06-22 ENCOUNTER — Encounter: Payer: Self-pay | Admitting: *Deleted

## 2022-06-23 ENCOUNTER — Institutional Professional Consult (permissible substitution): Payer: Self-pay | Admitting: Neurology

## 2022-07-17 IMAGING — DX DG CHEST 2V
2 series · 2 of 2 positions shown · non-contrast
Comparison: 01/31/2020

CLINICAL DATA: Shortness of breath

EXAM:
CHEST - 2 VIEW

[chest pa]
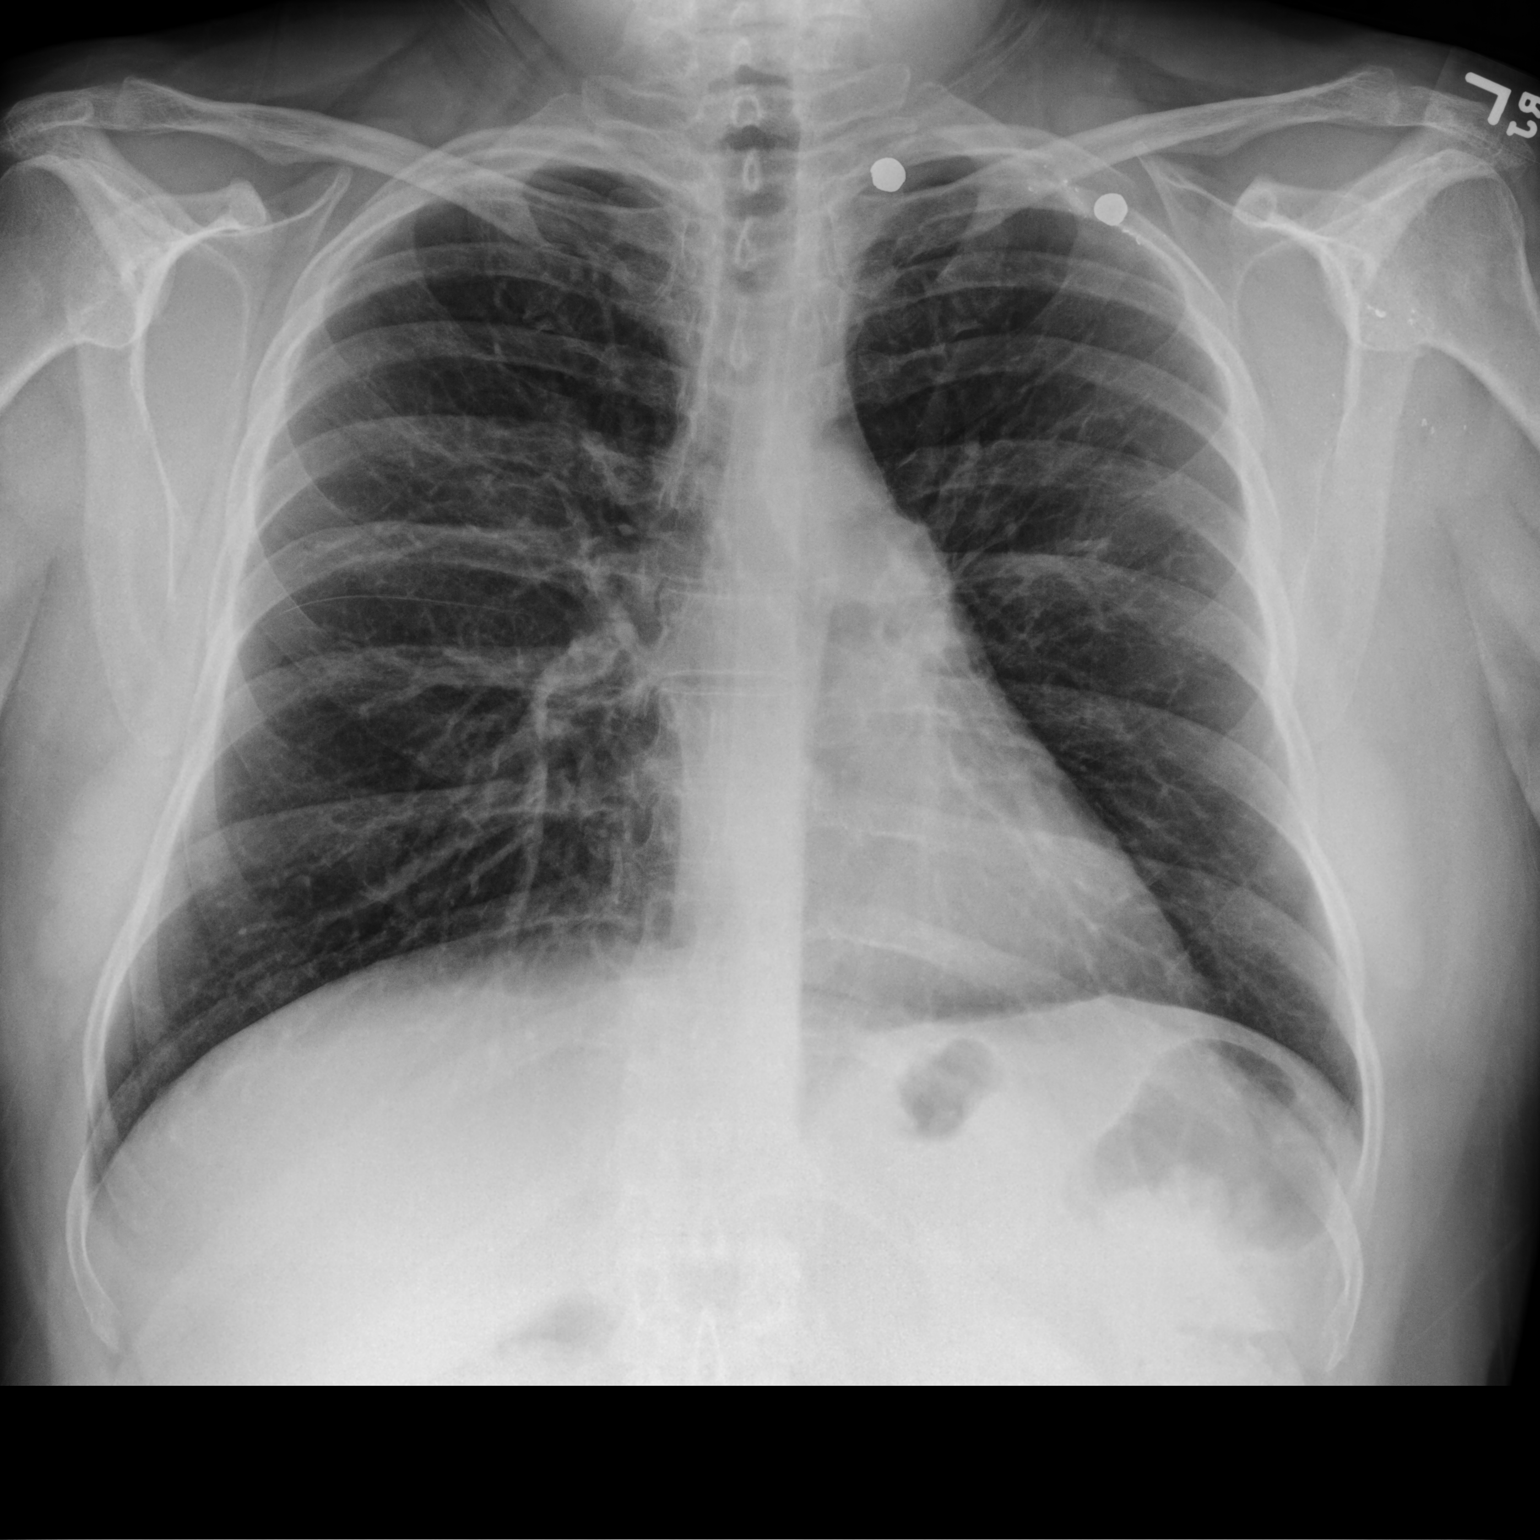

[chest lat]
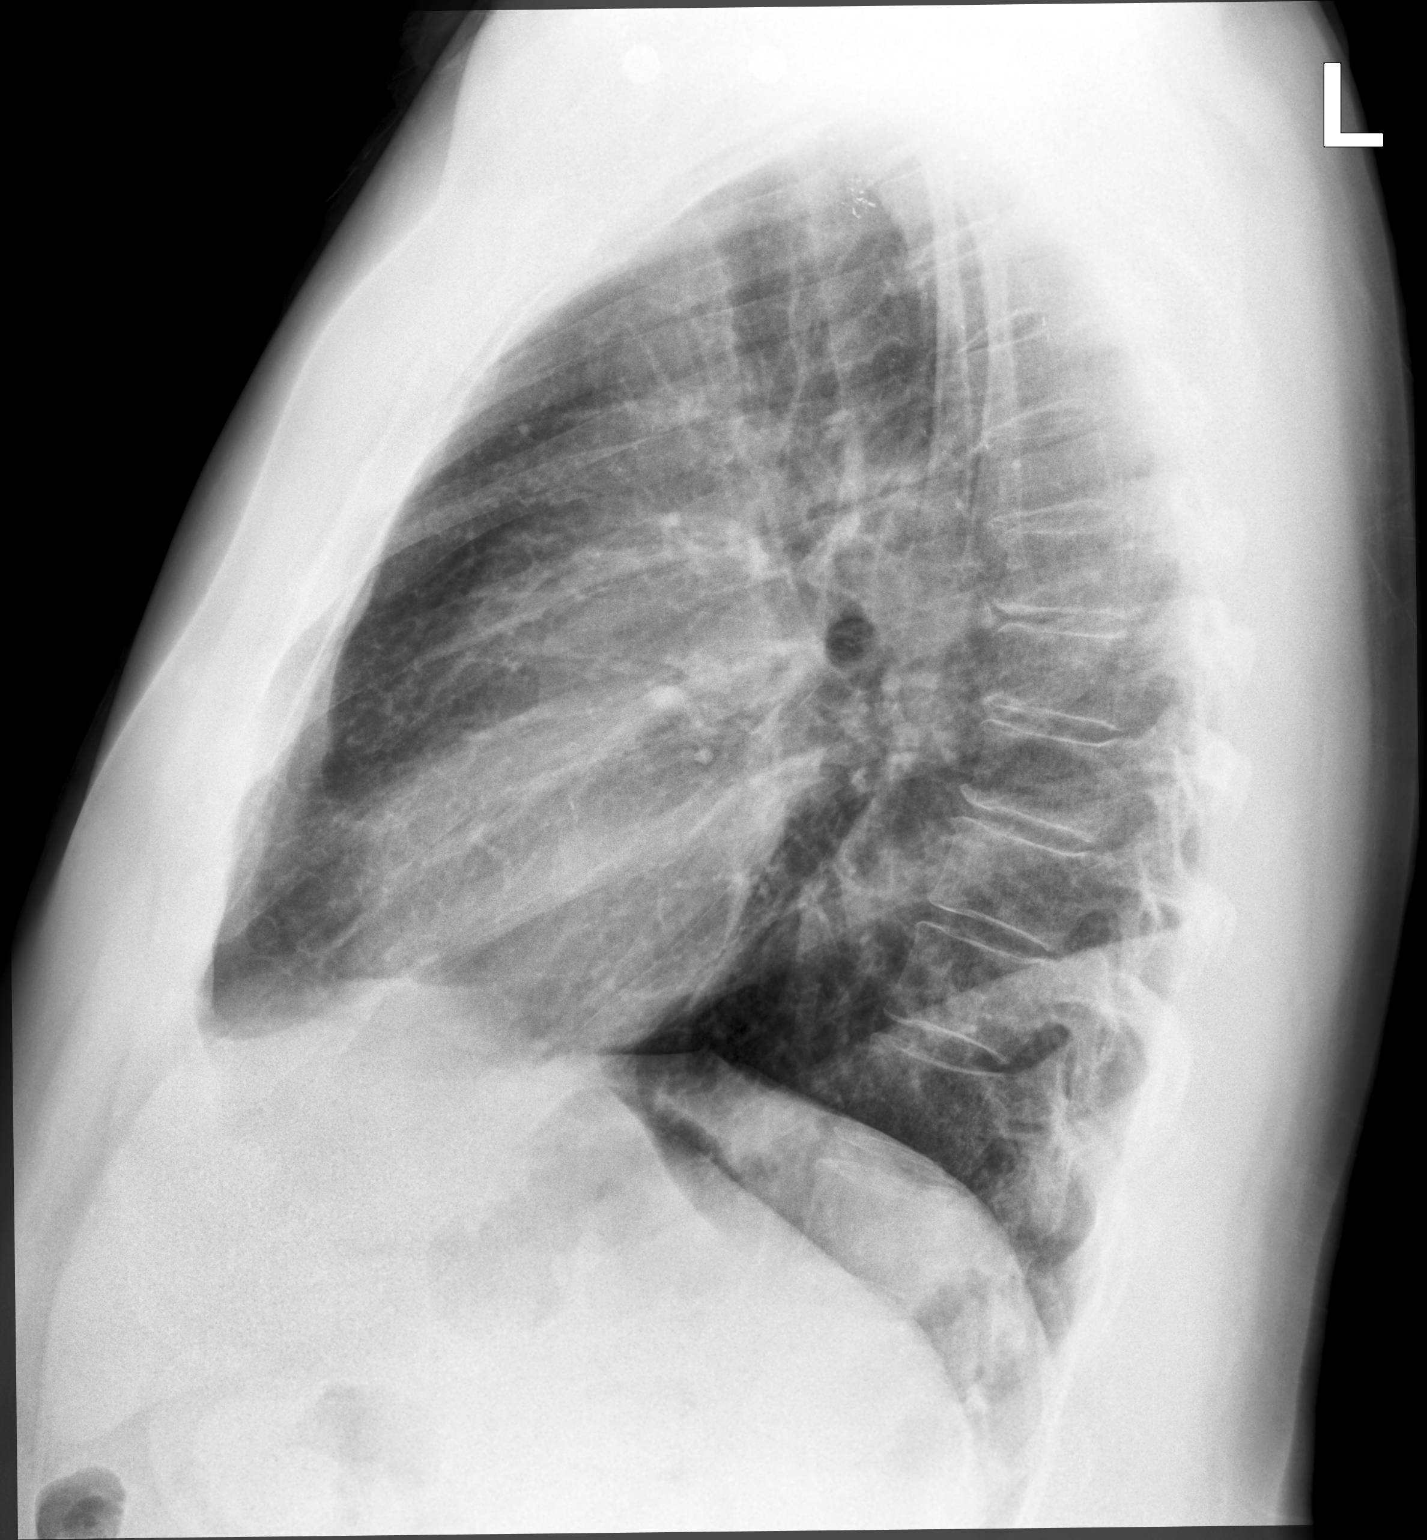

[2 of 2 positions shown; findings below may reference images not displayed]

FINDINGS: Chronic bronchitic changes. No acute consolidation or effusion. No
pneumothorax.
IMPRESSION: No active cardiopulmonary disease.

## 2022-07-28 ENCOUNTER — Encounter: Payer: BC Managed Care – PPO | Attending: Psychology | Admitting: Psychology

## 2022-07-28 ENCOUNTER — Encounter: Payer: Self-pay | Admitting: Psychology

## 2022-07-28 DIAGNOSIS — G479 Sleep disorder, unspecified: Secondary | ICD-10-CM

## 2022-07-28 DIAGNOSIS — R413 Other amnesia: Secondary | ICD-10-CM | POA: Diagnosis not present

## 2022-07-28 DIAGNOSIS — G90512 Complex regional pain syndrome I of left upper limb: Secondary | ICD-10-CM | POA: Insufficient documentation

## 2022-08-21 ENCOUNTER — Encounter: Payer: Self-pay | Admitting: Psychology

## 2022-08-21 NOTE — Progress Notes (Signed)
07/28/2022 8 AM-10 AM:  Today's visit was an in person visit that was conducted in my outpatient clinic office.  The patient myself were present.  Today we continue to work on therapeutic interventions and coping skills specifically related to his complex regional pain syndrome, sleep difficulties in the resulting impact his chronic pain has on his functional memory capacity and cognitive functioning.  The patient has continued to struggle with symptoms including symptoms potentially related to [place] although this is very difficult to delineate.  Patient continues with fatigue and trying to sustain physical activity.  The patient reports that his depressive symptoms have improved somewhat and he has worked on some of the therapeutic interventions we have worked on.  The patient has not been able to return to work but remains motivated to actively work on therapeutic interventions going forward.

## 2022-09-23 ENCOUNTER — Encounter: Payer: Self-pay | Admitting: Neurology

## 2022-10-05 IMAGING — CT CT HEAD WO/W CM
1 of 2 series · 13 of 30 positions shown, 17 images · IV contrast (iopamidol)
Comparison: Head CT May 09, 2010

CLINICAL DATA: Memory loss.

EXAM:
CT HEAD WITHOUT AND WITH CONTRAST
TECHNIQUE: Contiguous axial images were obtained from the base of the skull
through the vertex without and with intravenous contrast
CONTRAST:  75mL XNI9BF-IWW IOPAMIDOL (XNI9BF-IWW) INJECTION 61%

[Series 2: head w/(date) · axial · 0.48mm/px · z∈[-178,-33]mm · 13 of 35 slices shown, 17 images]
[im 3/35  brain]
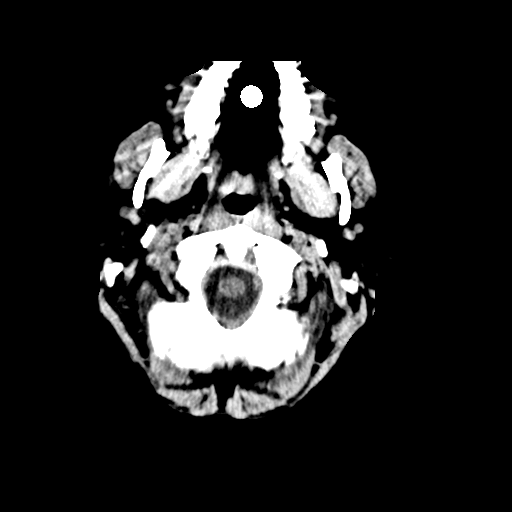
[im 3/35  bone]
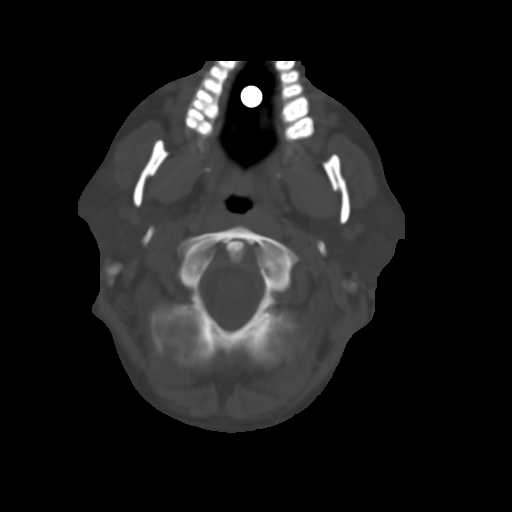
[im 5/35  brain]
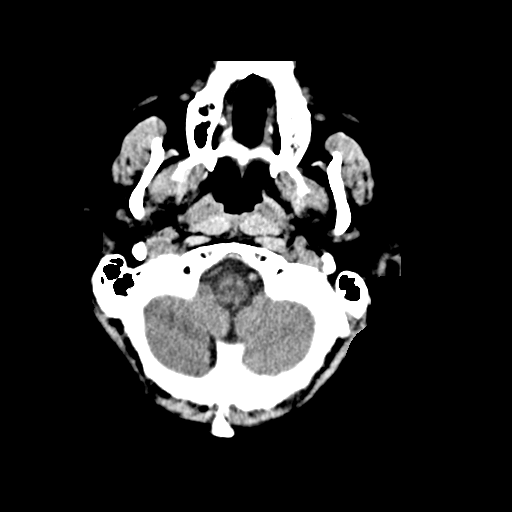
[im 8/35  brain]
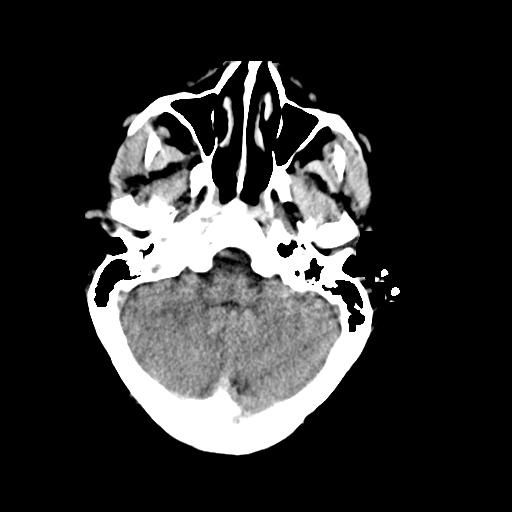
[im 10/35  brain]
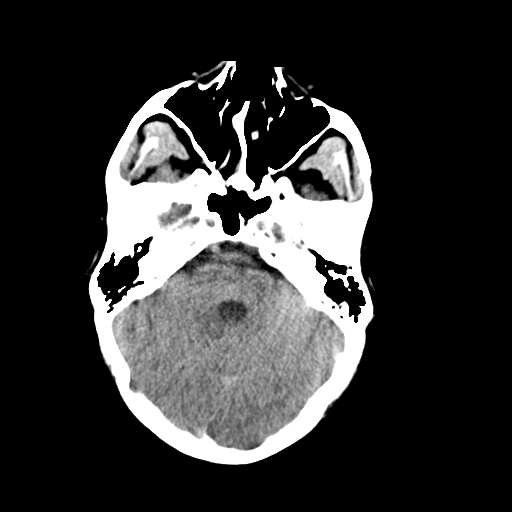
[im 13/35  brain]
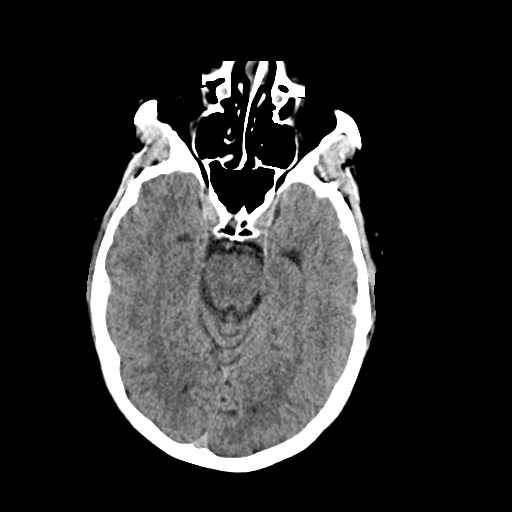
[im 13/35  bone]
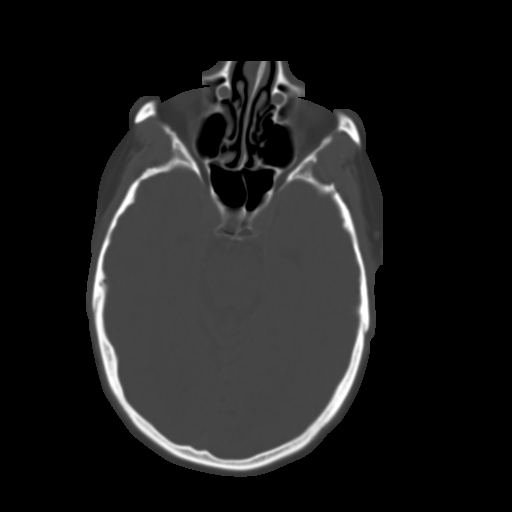
[im 15/35  brain]
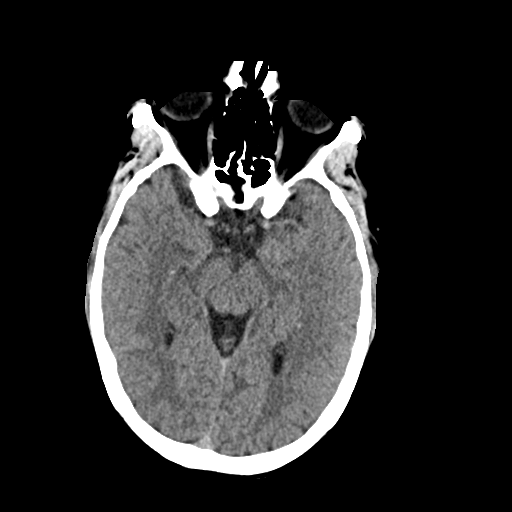
[im 18/35  brain]
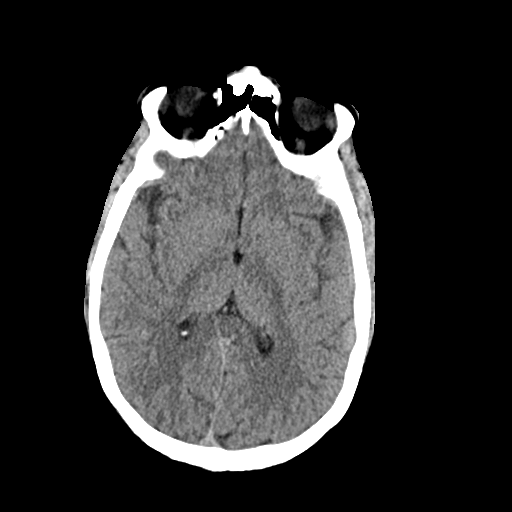
[im 20/35  brain]
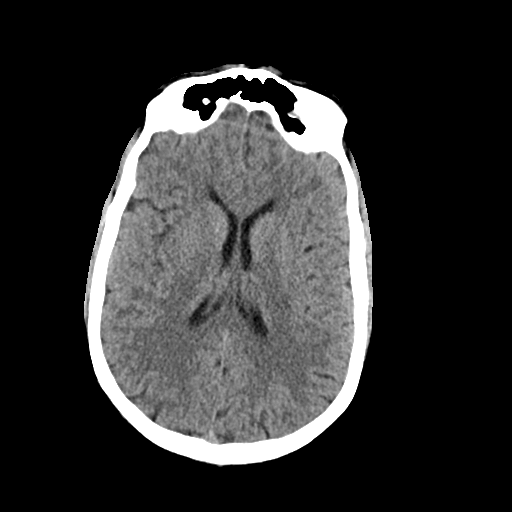
[im 22/35  brain]
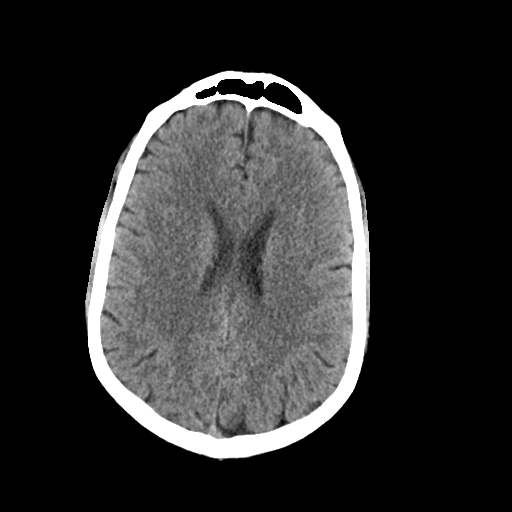
[im 22/35  bone]
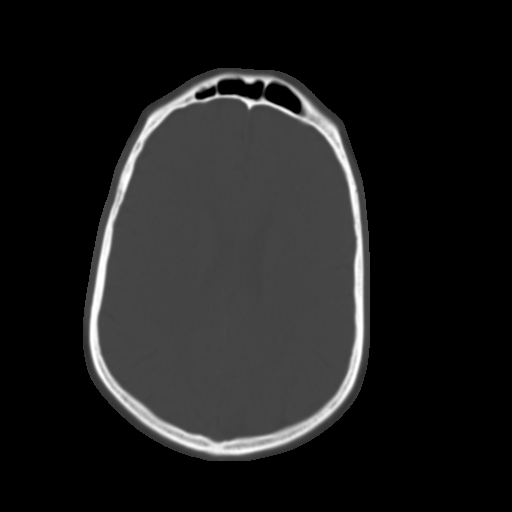
[im 25/35  brain]
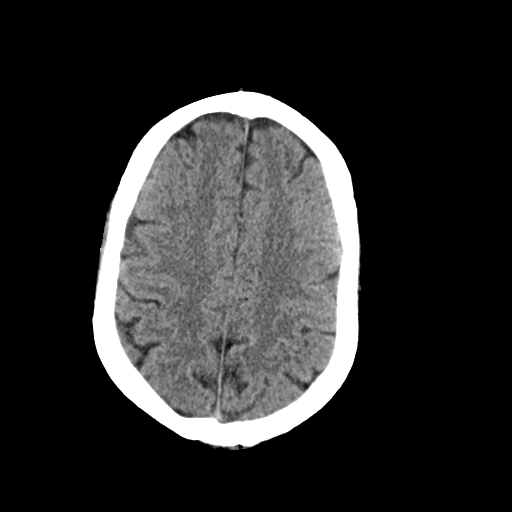
[im 27/35  brain]
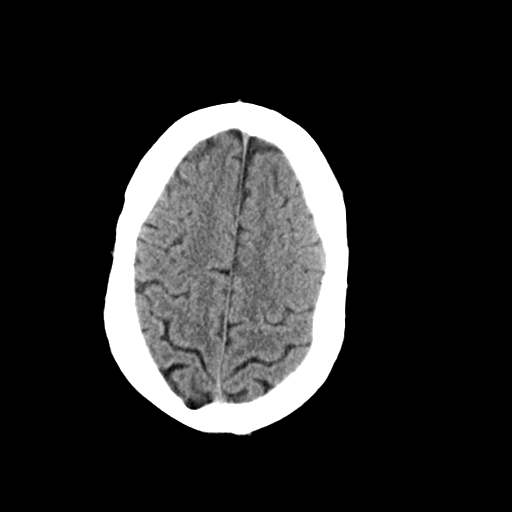
[im 30/35  brain]
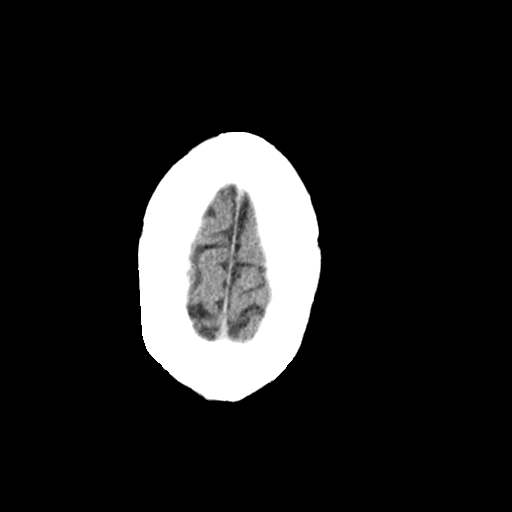
[im 32/35  brain]
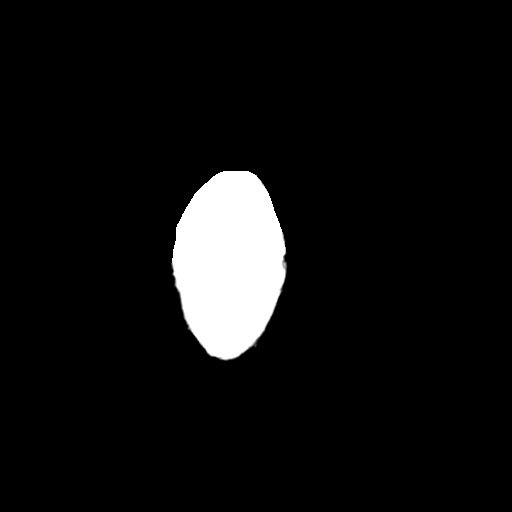
[im 32/35  bone]
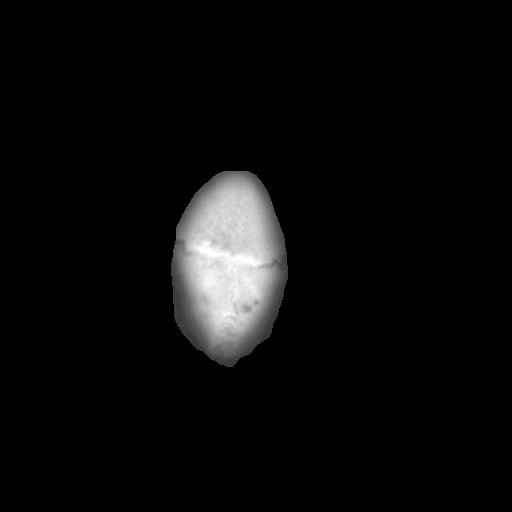

[13 of 30 positions shown; findings below may reference images not displayed]

FINDINGS: Brain: No evidence of acute infarction, hemorrhage, hydrocephalus,
extra-axial collection or mass lesion/mass effect. No focus of
abnormal contrast enhancement.

Vascular: No hyperdense vessel or unexpected calcification. Visible
vessels are patent.

Skull: Normal. Negative for fracture or focal lesion.

Sinuses/Orbits: No acute finding.

Other: None.
IMPRESSION: No acute intracranial abnormality.

## 2022-10-21 ENCOUNTER — Ambulatory Visit: Payer: BC Managed Care – PPO | Admitting: Neurology

## 2022-10-21 ENCOUNTER — Ambulatory Visit (INDEPENDENT_AMBULATORY_CARE_PROVIDER_SITE_OTHER): Payer: BC Managed Care – PPO | Admitting: Neurology

## 2022-10-21 ENCOUNTER — Encounter: Payer: Self-pay | Admitting: Neurology

## 2022-10-21 VITALS — BP 149/106 | HR 76 | Ht 69.0 in | Wt 272.6 lb

## 2022-10-21 DIAGNOSIS — R569 Unspecified convulsions: Secondary | ICD-10-CM
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
## 2022-10-21 NOTE — Progress Notes (Signed)
EEG complete - results pending 

## 2022-10-21 NOTE — Patient Instructions (Signed)
Good to meet you.  Schedule head CT without contrast  2. We will do EEG today. If normal, we will plan for the 2-day home EEG  3. It is prudent to recommend that all persons should be free of syncopal (episodes of loss of consciousness/awareness) episodes for at least six months to be granted the driving privilege." (THE [place] PHYSICIAN'S GUIDE TO DRIVER MEDICAL EVALUATION, Second Edition, Medical Review Branch, Associate Professor, Division of [employer] of Transportation, July 2004)   4. Follow-up in 3 months, call for any changes

## 2022-10-28 ENCOUNTER — Ambulatory Visit: Payer: BC Managed Care – PPO | Admitting: Neurology

## 2022-10-31 ENCOUNTER — Encounter: Payer: Self-pay | Admitting: Neurology

## 2022-11-01 NOTE — Procedures (Signed)
ELECTROENCEPHALOGRAM REPORT  Date of Study: 10/21/2022  Patient's Name: Gary Holland MRN: [id number] Date of Birth: 12/27/1975  Referring Provider: Dr. Patrcia Dolly  Clinical History: This is a 47 year old man with recurrent shaking episodes with left arm shaking/flexion. EEG for classification.  Medications: Gabapentin, Xanax, Celexa, Lisinopril, Metoprolol, Glucophage, Adderall  Technical Summary: A multichannel digital 1-hour EEG recording measured by the international 10-20 system with electrodes applied with paste and impedances below 5000 ohms performed in our laboratory with EKG monitoring in an awake and asleep patient.  Hyperventilation and photic stimulation were performed.  The digital EEG was referentially recorded, reformatted, and digitally filtered in a variety of bipolar and referential montages for optimal display.    Description: The patient is awake and asleep during the recording.  During maximal wakefulness, there is a symmetric, medium voltage 10 Hz posterior dominant rhythm that attenuates with eye opening.  The record is symmetric.  During drowsiness and sleep, there is an increase in theta slowing of the background.  Vertex waves and symmetric sleep spindles were seen. Hyperventilation and photic stimulation did not elicit any abnormalities. Patient attempts to induce an event with coughing, he then starts having significant coughing then irregular asynchronous left arm then right arm shaking is seen, left arm flexed with hand flexed at wrist. Shaking lasts 30 seconds, then he is unresponsive when eyelids are lifted. Electrographically, there was no epileptiform activity seen with movement artifact, PDR seen when eyes opened and unresponsive. There were no epileptiform discharges or electrographic seizures seen.    EKG lead was unremarkable.  Impression: This 1-hour awake and asleep EEG is normal. Episode of shaking as described above did not show EEG  correlate.  Clinical Correlation of the above findings indicates shaking episode induced by coughing was non-epileptic. Clinical correlation is advised.   Patrcia Dolly, M.D.

## 2022-11-04 ENCOUNTER — Ambulatory Visit
Admission: RE | Admit: 2022-11-04 | Discharge: 2022-11-04 | Disposition: A | Discharge status: Home / Self Care | Payer: BC Managed Care – PPO | Source: Ambulatory Visit | Attending: Neurology | Admitting: Neurology

## 2022-11-04 DIAGNOSIS — R569 Unspecified convulsions: Secondary | ICD-10-CM

## 2022-11-12 ENCOUNTER — Encounter: Payer: Self-pay | Admitting: Neurology

## 2022-11-21 ENCOUNTER — Encounter: Payer: Self-pay | Admitting: Neurology

## 2023-02-06 ENCOUNTER — Ambulatory Visit (INDEPENDENT_AMBULATORY_CARE_PROVIDER_SITE_OTHER): Payer: BC Managed Care – PPO | Admitting: Neurology

## 2023-02-06 ENCOUNTER — Encounter: Payer: Self-pay | Admitting: Neurology

## 2023-02-06 VITALS — BP 130/82 | HR 125 | Ht 68.0 in | Wt 274.2 lb

## 2023-02-06 DIAGNOSIS — R251 Tremor, unspecified: Secondary | ICD-10-CM
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
## 2023-02-06 NOTE — Patient Instructions (Addendum)
I hope you get the help you need. I would recommend the following:  Since the cough triggers majority of your symptoms, please call Dr. Delton Coombes for another follow-up to discuss upper airway irritation   2. Recommend contacting Dr. Lanae Boast for evaluation for possible Dysautonomia.  [email or web address]  [email or web address]  3. Here are the 2 psychiatrists we discussed:  Dr. Neysa Hotter  Dr. Tia Masker   4. Proceed with follow-up with Dr. Kieth Brightly for psychotherapy   5. I am happy to see you in the future if needed

## 2023-03-22 ENCOUNTER — Encounter: Payer: BC Managed Care – PPO | Attending: Psychology | Admitting: Psychology

## 2023-03-22 DIAGNOSIS — G479 Sleep disorder, unspecified: Secondary | ICD-10-CM | POA: Diagnosis present

## 2023-03-22 DIAGNOSIS — G90512 Complex regional pain syndrome I of left upper limb: Secondary | ICD-10-CM | POA: Diagnosis present

## 2023-03-22 DIAGNOSIS — R413 Other amnesia: Secondary | ICD-10-CM | POA: Diagnosis present

## 2023-03-29 NOTE — Progress Notes (Signed)
03/22/2023 10 AM-11 AM:  Today's visit was an in person visit that was conducted in my outpatient clinic office.  The patient myself were present.  Today we continue to work on therapeutic interventions and coping skills specifically related to his complex regional pain syndrome, sleep difficulties in the resulting impact his chronic pain has on his functional memory capacity and cognitive functioning.  The patient has continued to struggle with symptoms including symptoms potentially related to long COVID although this is very difficult to delineate.  Patient continues with fatigue and trying to sustain physical activity.  The patient reports that his depressive symptoms have improved somewhat and he has worked on some of the therapeutic interventions we have worked on.  The patient has not been able to return to work but remains motivated to actively work on therapeutic interventions going forward.

## 2023-04-19 ENCOUNTER — Institutional Professional Consult (permissible substitution): Payer: BC Managed Care – PPO | Admitting: Emergency Medicine

## 2023-06-14 ENCOUNTER — Encounter: Payer: BC Managed Care – PPO | Admitting: Psychology
